# Patient Record
Sex: Male | Born: 2000 | Race: Black or African American | Hispanic: No | Marital: Single | Smoking: Never smoker
Health system: Southern US, Community
[De-identification: ages and names within clinical notes are randomized; demographics above are authoritative.]

---
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
## 2020-08-28 ENCOUNTER — Ambulatory Visit: Payer: Self-pay

## 2020-08-28 ENCOUNTER — Other Ambulatory Visit: Payer: Self-pay | Admitting: Family Medicine

## 2020-08-28 ENCOUNTER — Other Ambulatory Visit: Payer: Self-pay

## 2020-08-28 DIAGNOSIS — Z0000 Encounter for general adult medical examination without abnormal findings: Secondary | ICD-10-CM

## 2021-02-28 ENCOUNTER — Emergency Department (HOSPITAL_COMMUNITY)
Admission: EM | Admit: 2021-02-28 | Discharge: 2021-02-28 | Disposition: A | Discharge status: Home / Self Care | Payer: BC Managed Care – PPO | Attending: Emergency Medicine | Admitting: Emergency Medicine

## 2021-02-28 ENCOUNTER — Other Ambulatory Visit: Payer: Self-pay

## 2021-02-28 ENCOUNTER — Emergency Department (HOSPITAL_COMMUNITY): Payer: BC Managed Care – PPO

## 2021-02-28 ENCOUNTER — Encounter (HOSPITAL_COMMUNITY): Payer: Self-pay

## 2021-02-28 DIAGNOSIS — S93432A Sprain of tibiofibular ligament of left ankle, initial encounter: Secondary | ICD-10-CM | POA: Insufficient documentation

## 2021-02-28 DIAGNOSIS — S93492A Sprain of other ligament of left ankle, initial encounter: Secondary | ICD-10-CM

## 2021-02-28 DIAGNOSIS — S99912A Unspecified injury of left ankle, initial encounter: Secondary | ICD-10-CM | POA: Diagnosis present

## 2021-02-28 DIAGNOSIS — M25572 Pain in left ankle and joints of left foot: Secondary | ICD-10-CM | POA: Insufficient documentation

## 2021-02-28 DIAGNOSIS — X501XXA Overexertion from prolonged static or awkward postures, initial encounter: Secondary | ICD-10-CM | POA: Insufficient documentation

## 2021-02-28 DIAGNOSIS — Y9367 Activity, basketball: Secondary | ICD-10-CM | POA: Diagnosis not present

## 2021-02-28 NOTE — Discharge Instructions (Signed)
Please use Tylenol or ibuprofen for pain.  You may use 600 mg ibuprofen every 6 hours or 1000 mg of Tylenol every 6 hours.  You may choose to alternate between the 2.  This would be most effective.  Not to exceed 4 g of Tylenol within 24 hours.  Not to exceed 3200 mg ibuprofen 24 hours.  Rest the affected limb, apply ice. You can begin to bear weight as tolerated. If the pain is not improving despite all of the above in 1-2 weeks I recommend you follow up with Orthopedics.

## 2021-02-28 NOTE — ED Triage Notes (Signed)
Pt arrived via POV, c/o left ankle injury after landing on it while playing basketball.

## 2021-02-28 NOTE — ED Provider Notes (Signed)
[facility]-EMERGENCY DEPT Provider Note   CSN: [id number] Arrival date & time: 02/28/21  1723     History  Chief Complaint  Patient presents with   Ankle Pain    Seth Greene is a 21 y.o. male with no significant past medical history presents with complaint of rolling his left ankle while playing bass ball at 11 AM.  Patient reports that he has had difficulty walking on the left ankle since then.  He has not taken anything for pain prior to arrival.  Patient denies any numbness, tingling.   Ankle Pain     Home Medications Prior to Admission medications   Medication Sig Start Date End Date Taking? Authorizing Provider  ciprofloxacin (CIPRO) 500 MG tablet Take 1 tablet (500 mg total) by mouth 2 (two) times daily. 08/21/18   Elvina Sidle, MD      Allergies    Patient has no known allergies.    Review of Systems   Review of Systems  Musculoskeletal:  Positive for joint swelling.  All other systems reviewed and are negative.  Physical Exam Updated Vital Signs BP 122/87 (BP Location: Left Arm)    Pulse 84    Temp 98.1 F (36.7 C) (Oral)    Resp 18    SpO2 100%  Physical Exam Vitals and nursing note reviewed.  Constitutional:      General: He is not in acute distress.    Appearance: Normal appearance.  HENT:     Head: Normocephalic and atraumatic.  Eyes:     General:        Right eye: No discharge.        Left eye: No discharge.  Cardiovascular:     Rate and Rhythm: Normal rate and regular rhythm.     Pulses: Normal pulses.     Comments: DP, PT pulses intact bilaterally. Pulmonary:     Effort: Pulmonary effort is normal. No respiratory distress.  Musculoskeletal:        General: No deformity.     Comments: With tenderness to palpation on the ATFL distribution of the left ankle.  No step-off or deformity noted.  Intact range of motion both passively and actively.  Intact strength to plantar and dorsiflexion.  Intact flexion and extension  of the toes.  Skin:    General: Skin is warm and dry.     Capillary Refill: Capillary refill takes less than 2 seconds.  Neurological:     Mental Status: He is alert and oriented to person, place, and time.  Psychiatric:        Mood and Affect: Mood normal.        Behavior: Behavior normal.    ED Results / Procedures / Treatments   Labs (all labs ordered are listed, but only abnormal results are displayed) Labs Reviewed - No data to display  EKG None  Radiology DG Ankle Complete Left  Result Date: 02/28/2021 CLINICAL DATA:  Left ankle pain after falling on a while playing basketball. EXAM: LEFT ANKLE COMPLETE - 3+ VIEW COMPARISON:  None. FINDINGS: There is no evidence of fracture, dislocation, or joint effusion. There is no evidence of arthropathy or other focal bone abnormality. Soft tissues are unremarkable. IMPRESSION: Negative. Electronically Signed   By: Emmaline Kluver M.D.   On: 02/28/2021 18:34    Procedures Procedures    Medications Ordered in ED Medications - No data to display  ED Course/ Medical Decision Making/ A&P  Medical Decision Making Amount and/or Complexity of Data Reviewed Radiology: ordered.   This is an overall well-appearing 21 year old male who presents with acute left ankle injury.  My emergent differential diagnosis includes fracture, dislocation, including open fracture and dislocation.  On exam patient has tenderness palpation the ATFL distribution, pain with eversion of the ankle.  Findings consistent with acute ankle sprain.  I personally ordered and interpreted radiographic imaging of the left ankle which shows no fracture or dislocation.  Discussed with patient that he is able to weight-bear as tolerated, however as he is having significant pain at this time we will provide lace up ankle brace, crutches.  Encouraged him to begin with weightbearing as tolerated in the morning.  Encouraged ice, elevation,  ibuprofen, Tylenol.  Encourage follow-up with orthopedics if pain does not improve despite treatment for 1 to 2 weeks. Final Clinical Impression(s) / ED Diagnoses Final diagnoses:  Sprain of anterior talofibular ligament of left ankle, initial encounter    Rx / DC Orders ED Discharge Orders     None         Olene Floss, PA-C 02/28/21 1940    Franne Forts, DO 03/01/21 1309

## 2021-10-22 ENCOUNTER — Encounter (HOSPITAL_COMMUNITY): Payer: Self-pay | Admitting: Emergency Medicine

## 2021-10-22 ENCOUNTER — Other Ambulatory Visit: Payer: Self-pay

## 2021-10-22 ENCOUNTER — Ambulatory Visit (HOSPITAL_COMMUNITY)
Admission: EM | Admit: 2021-10-22 | Discharge: 2021-10-22 | Disposition: A | Discharge status: Home / Self Care | Payer: BC Managed Care – PPO | Attending: Internal Medicine | Admitting: Internal Medicine

## 2021-10-22 DIAGNOSIS — R059 Cough, unspecified: Secondary | ICD-10-CM | POA: Diagnosis not present

## 2021-10-22 DIAGNOSIS — J069 Acute upper respiratory infection, unspecified: Secondary | ICD-10-CM | POA: Insufficient documentation

## 2021-10-22 DIAGNOSIS — Z79899 Other long term (current) drug therapy: Secondary | ICD-10-CM | POA: Diagnosis not present

## 2021-10-22 DIAGNOSIS — Z1152 Encounter for screening for COVID-19: Secondary | ICD-10-CM | POA: Diagnosis not present

## 2021-10-22 LAB — RESP PANEL BY RT-PCR (FLU A&B, COVID) ARPGX2
Influenza A by PCR: NEGATIVE
Influenza B by PCR: NEGATIVE
SARS Coronavirus 2 by RT PCR: NEGATIVE

## 2021-10-22 MED ORDER — LIDOCAINE VISCOUS HCL 2 % MT SOLN: 15.0000 mL | Freq: Four times a day (QID) | OROMUCOSAL | 0 refills | Status: AC | PRN

## 2021-10-22 MED ORDER — IBUPROFEN 600 MG PO TABS: 600.0000 mg | ORAL_TABLET | Freq: Four times a day (QID) | ORAL | 0 refills | Status: AC | PRN

## 2021-10-22 MED ORDER — BENZONATATE 100 MG PO CAPS: 100.0000 mg | ORAL_CAPSULE | Freq: Three times a day (TID) | ORAL | 0 refills | Status: AC | PRN

## 2021-10-22 NOTE — Discharge Instructions (Addendum)
Maintain adequate hydration Warm salt water gargle Please take medications as prescribed We will call you with recommendations if labs are abnormal Please quarantine until lab results are available Return to urgent care if symptoms persist or worsens

## 2021-10-22 NOTE — ED Triage Notes (Signed)
Symptoms started yesterday.  Throat hurts with swallowing or coughing.  Complains of cough and sneezing and bad headaches.   Patient has not done a covid test.   Patient has not taken any medications for symptoms

## 2021-10-22 NOTE — ED Provider Notes (Signed)
[place]    CSN: [id number] Arrival date & time: 10/22/21  0808      History   Chief Complaint Chief Complaint  Patient presents with   URI    HPI Seth Greene is a 21 y.o. male comes to the urgent care with 1 day history of sore throat, cough and sneezing as well as headaches.  Symptoms started yesterday and has been persistent.  Patient's brother had similar symptoms several days ago and he apparently tested negative.  Patient denies any shortness of breath, wheezing, chest tightness or generalized body aches.  No dizziness, near syncope or syncopal episodes.  No diarrhea.  Patient is fully vaccinated against COVID-19 virus.  He denies any fever or chills  HPI  History reviewed. No pertinent past medical history.  There are no problems to display for this patient.   History reviewed. No pertinent surgical history.     Home Medications    Prior to Admission medications   Medication Sig Start Date End Date Taking? Authorizing Provider  benzonatate (TESSALON) 100 MG capsule Take 1 capsule (100 mg total) by mouth 3 (three) times daily as needed for cough. 10/22/21  Yes Xitlally Mooneyham, Myrene Galas, MD  ibuprofen (ADVIL) 600 MG tablet Take 1 tablet (600 mg total) by mouth every 6 (six) hours as needed. 10/22/21  Yes Makai Dumond, Myrene Galas, MD  magic mouthwash (lidocaine, diphenhydrAMINE, alum & mag hydroxide) suspension Swish and swallow 15 mLs 4 (four) times daily as needed for mouth pain. Compounding formula: Maalox-80 mL, viscous lidocaine 2%-80 mL, Benadryl 12.5 mg/ML-80 mg. 10/22/21  Yes Abubakar Crispo, Myrene Galas, MD    Family History Family History  Problem Relation Age of Onset   Healthy Mother    Healthy Father     Social History Social History   Tobacco Use   Smoking status: Never   Smokeless tobacco: Never  Vaping Use   Vaping Use: Never used  Substance Use Topics   Alcohol use: Never   Drug use: Never     Allergies   Patient has no known  allergies.   Review of Systems Review of Systems  Constitutional: Negative.   HENT:  Positive for sore throat.   Respiratory:  Positive for cough. Negative for shortness of breath, wheezing and stridor.   Genitourinary: Negative.   Musculoskeletal:  Negative for arthralgias and myalgias.  Neurological:  Positive for headaches.     Physical Exam Triage Vital Signs ED Triage Vitals  Enc Vitals Group     BP 10/22/21 0855 123/77     Pulse Rate 10/22/21 0855 88     Resp 10/22/21 0855 18     Temp 10/22/21 0855 98.2 F (36.8 C)     Temp Source 10/22/21 0855 Oral     SpO2 10/22/21 0855 97 %     Weight --      Height --      Head Circumference --      Peak Flow --      Pain Score 10/22/21 0851 7     Pain Loc --      Pain Edu? --      Excl. in [place]? --    No data found.  Updated Vital Signs BP 123/77 (BP Location: Right Arm)   Pulse 88   Temp 98.2 F (36.8 C) (Oral)   Resp 18   SpO2 97%   Visual Acuity Right Eye Distance:   Left Eye Distance:   Bilateral Distance:    Right Eye  Near:   Left Eye Near:    Bilateral Near:     Physical Exam Vitals and nursing note reviewed.  Constitutional:      General: He is not in acute distress.    Appearance: He is ill-appearing. He is not toxic-appearing or diaphoretic.  HENT:     Right Ear: Tympanic membrane normal.     Left Ear: Tympanic membrane normal.     Nose: No rhinorrhea.     Mouth/Throat:     Pharynx: No posterior oropharyngeal erythema.  Cardiovascular:     Rate and Rhythm: Normal rate and regular rhythm.     Pulses: Normal pulses.     Heart sounds: Normal heart sounds.  Abdominal:     General: There is no distension.     Tenderness: There is no guarding.  Neurological:     Mental Status: He is alert.      UC Treatments / Results  Labs (all labs ordered are listed, but only abnormal results are displayed) Labs Reviewed  RESP PANEL BY RT-PCR (FLU A&B, COVID) [id number]    EKG   Radiology No results  found.  Procedures Procedures (including critical care time)  Medications Ordered in UC Medications - No data to display  Initial Impression / Assessment and Plan / UC Course  I have reviewed the triage vital signs and the nursing notes.  Pertinent labs & imaging results that were available during my care of the patient were reviewed by me and considered in my medical decision making (see chart for details).     1.  Viral URI with cough: Tessalon Perles as needed for cough COVID-19 PCR test has been sent Flu a plus B has been sent Maintain adequate hydration Chloraseptic throat spray or Magic mouthwash as needed Return to urgent care if symptoms worsen. We will call you with recommendations if labs are abnormal. Final Clinical Impressions(s) / UC Diagnoses   Final diagnoses:  Viral URI with cough     Discharge Instructions      Maintain adequate hydration Warm salt water gargle Please take medications as prescribed We will call you with recommendations if labs are abnormal Please quarantine until lab results are available Return to urgent care if symptoms persist or worsens   ED Prescriptions     Medication Sig Dispense Auth. Provider   benzonatate (TESSALON) 100 MG capsule Take 1 capsule (100 mg total) by mouth 3 (three) times daily as needed for cough. 21 capsule Tannya Gonet, Britta Mccreedy, MD   ibuprofen (ADVIL) 600 MG tablet Take 1 tablet (600 mg total) by mouth every 6 (six) hours as needed. 30 tablet Shauntae Reitman, Britta Mccreedy, MD   magic mouthwash (lidocaine, diphenhydrAMINE, alum & mag hydroxide) suspension Swish and swallow 15 mLs 4 (four) times daily as needed for mouth pain. Compounding formula: Maalox-80 mL, viscous lidocaine 2%-80 mL, Benadryl 12.5 mg/ML-80 mg. 240 mL Johana Hopkinson, Britta Mccreedy, MD      PDMP not reviewed this encounter.   Merrilee Jansky, MD 10/22/21 [phone]

## 2022-01-02 ENCOUNTER — Other Ambulatory Visit: Payer: Self-pay

## 2022-01-02 ENCOUNTER — Ambulatory Visit (HOSPITAL_COMMUNITY)
Admission: EM | Admit: 2022-01-02 | Discharge: 2022-01-02 | Disposition: A | Discharge status: Home / Self Care | Payer: BC Managed Care – PPO | Attending: Physician Assistant | Admitting: Physician Assistant

## 2022-01-02 ENCOUNTER — Encounter (HOSPITAL_COMMUNITY): Payer: Self-pay | Admitting: Emergency Medicine

## 2022-01-02 DIAGNOSIS — J069 Acute upper respiratory infection, unspecified: Secondary | ICD-10-CM

## 2022-01-02 DIAGNOSIS — U071 COVID-19: Secondary | ICD-10-CM | POA: Diagnosis present

## 2022-01-02 LAB — RESP PANEL BY RT-PCR (FLU A&B, COVID) ARPGX2
Influenza A by PCR: NEGATIVE
Influenza B by PCR: NEGATIVE
SARS Coronavirus 2 by RT PCR: POSITIVE — AB

## 2022-01-02 MED ORDER — PROMETHAZINE-DM 6.25-15 MG/5ML PO SYRP: 5.0000 mL | ORAL_SOLUTION | Freq: Four times a day (QID) | ORAL | 0 refills | Status: AC | PRN

## 2022-01-02 NOTE — Discharge Instructions (Addendum)
COVID/Flu test pending, will call with results Recommend supportive care like rest drinking plenty of fluids. Can take Tylenol or ibuprofen as needed for headaches and bodyaches. Take cough syrup as needed for cough. Recommend staying away from others for 5 days and then wear a mask around others for 5 days.  Return if your symptoms become worse.

## 2022-01-02 NOTE — ED Provider Notes (Signed)
[facility]    CSN: [id number] Arrival date & time: 01/02/22  1120      History   Chief Complaint Chief Complaint  Patient presents with   Cough    HPI Seth Greene is a 21 y.o. male.   Patient complains of cough, congestion, headache that started yesterday.  Reports chills and bodyaches.  Denies wheezing or shortness of breat. he has taken nothing for the symptoms.  Reports wife is sick with similar symptoms.  Cough is worse at night.    History reviewed. No pertinent past medical history.  There are no problems to display for this patient.   History reviewed. No pertinent surgical history.     Home Medications    Prior to Admission medications   Medication Sig Start Date End Date Taking? Authorizing Provider  promethazine-dextromethorphan (PROMETHAZINE-DM) 6.25-15 MG/5ML syrup Take 5 mLs by mouth 4 (four) times daily as needed for cough. 01/02/22  Yes Ward, Tylene Fantasia, PA-C  benzonatate (TESSALON) 100 MG capsule Take 1 capsule (100 mg total) by mouth 3 (three) times daily as needed for cough. Patient not taking: Reported on 01/02/2022 10/22/21   Merrilee Jansky, MD  ibuprofen (ADVIL) 600 MG tablet Take 1 tablet (600 mg total) by mouth every 6 (six) hours as needed. 10/22/21   LampteyBritta Mccreedy, MD  magic mouthwash (lidocaine, diphenhydrAMINE, alum & mag hydroxide) suspension Swish and swallow 15 mLs 4 (four) times daily as needed for mouth pain. Compounding formula: Maalox-80 mL, viscous lidocaine 2%-80 mL, Benadryl 12.5 mg/ML-80 mg. Patient not taking: Reported on 01/02/2022 10/22/21   Lamptey, Britta Mccreedy, MD    Family History Family History  Problem Relation Age of Onset   Healthy Mother    Healthy Father     Social History Social History   Tobacco Use   Smoking status: Never   Smokeless tobacco: Never  Vaping Use   Vaping Use: Never used  Substance Use Topics   Alcohol use: Never   Drug use: Never     Allergies   Patient has no  known allergies.   Review of Systems Review of Systems  Constitutional:  Positive for chills. Negative for fever.  HENT:  Positive for congestion. Negative for ear pain and sore throat.   Eyes:  Negative for pain and visual disturbance.  Respiratory:  Positive for cough. Negative for shortness of breath and wheezing.   Cardiovascular:  Negative for chest pain and palpitations.  Gastrointestinal:  Negative for abdominal pain and vomiting.  Genitourinary:  Negative for dysuria and hematuria.  Musculoskeletal:  Negative for arthralgias and back pain.  Skin:  Negative for color change and rash.  Neurological:  Negative for seizures and syncope.  All other systems reviewed and are negative.    Physical Exam Triage Vital Signs ED Triage Vitals  Enc Vitals Group     BP 01/02/22 1328 115/80     Pulse Rate 01/02/22 1328 (!) 105     Resp 01/02/22 1328 20     Temp 01/02/22 1328 98.8 F (37.1 C)     Temp Source 01/02/22 1328 Oral     SpO2 01/02/22 1328 98 %     Weight --      Height --      Head Circumference --      Peak Flow --      Pain Score 01/02/22 1327 5     Pain Loc --      Pain Edu? --  Excl. in GC? --    No data found.  Updated Vital Signs BP 115/80 (BP Location: Right Arm)   Pulse (!) 105   Temp 98.8 F (37.1 C) (Oral)   Resp 20   SpO2 98%   Visual Acuity Right Eye Distance:   Left Eye Distance:   Bilateral Distance:    Right Eye Near:   Left Eye Near:    Bilateral Near:     Physical Exam Vitals and nursing note reviewed.  Constitutional:      General: He is not in acute distress.    Appearance: He is well-developed.  HENT:     Head: Normocephalic and atraumatic.  Eyes:     Conjunctiva/sclera: Conjunctivae normal.  Cardiovascular:     Rate and Rhythm: Normal rate and regular rhythm.     Heart sounds: No murmur heard. Pulmonary:     Effort: Pulmonary effort is normal. No respiratory distress.     Breath sounds: Normal breath sounds.   Abdominal:     Palpations: Abdomen is soft.     Tenderness: There is no abdominal tenderness.  Musculoskeletal:        General: No swelling.     Cervical back: Neck supple.  Skin:    General: Skin is warm and dry.     Capillary Refill: Capillary refill takes less than 2 seconds.  Neurological:     Mental Status: He is alert.  Psychiatric:        Mood and Affect: Mood normal.      UC Treatments / Results  Labs (all labs ordered are listed, but only abnormal results are displayed) Labs Reviewed  RESP PANEL BY RT-PCR (FLU A&B, COVID) [id number]    EKG   Radiology No results found.  Procedures Procedures (including critical care time)  Medications Ordered in UC Medications - No data to display  Initial Impression / Assessment and Plan / UC Course  I have reviewed the triage vital signs and the nursing notes.  Pertinent labs & imaging results that were available during my care of the patient were reviewed by me and considered in my medical decision making (see chart for details).     URI, COVID-19 and flu test pending.  Patient overall well-appearing in no acute distress, vitals within normal, lungs clear. Stable for discharge with supportive care. Return precautions discussed.  Final Clinical Impressions(s) / UC Diagnoses   Final diagnoses:  Acute upper respiratory infection     Discharge Instructions      COVID/Flu test pending, will call with results Recommend supportive care like rest drinking plenty of fluids. Can take Tylenol or ibuprofen as needed for headaches and bodyaches. Take cough syrup as needed for cough. Recommend staying away from others for 5 days and then wear a mask around others for 5 days.  Return if your symptoms become worse.     ED Prescriptions     Medication Sig Dispense Auth. Provider   promethazine-dextromethorphan (PROMETHAZINE-DM) 6.25-15 MG/5ML syrup Take 5 mLs by mouth 4 (four) times daily as needed for cough. 118 mL Ward,  Tylene Fantasia, PA-C      PDMP not reviewed this encounter.   Ward, Tylene Fantasia, PA-C 01/02/22 1443

## 2022-01-02 NOTE — ED Triage Notes (Signed)
Cough and headache started yesterday.  Patient reports a runny nose.  Patient has not taken any medications.

## 2022-01-06 ENCOUNTER — Telehealth (HOSPITAL_COMMUNITY): Payer: Self-pay | Admitting: Emergency Medicine

## 2022-01-06 NOTE — Telephone Encounter (Signed)
Patient returned call after third attempt and we rviewed his positive COVID result.  No questions at this time

## 2022-02-16 ENCOUNTER — Telehealth: Payer: Self-pay

## 2022-02-16 NOTE — Telephone Encounter (Signed)
Sending mychart msg. AS, CMA 

## 2023-03-23 DIAGNOSIS — H5213 Myopia, bilateral: Secondary | ICD-10-CM | POA: Diagnosis not present

## 2023-07-14 IMAGING — CR DG ANKLE COMPLETE 3+V*L*
4 series · 4 of 4 positions shown · non-contrast
Comparison: None.

CLINICAL DATA: Left ankle pain after falling on a while playing
basketball.

EXAM:
LEFT ANKLE COMPLETE - 3+ VIEW

[x ankle lat left]
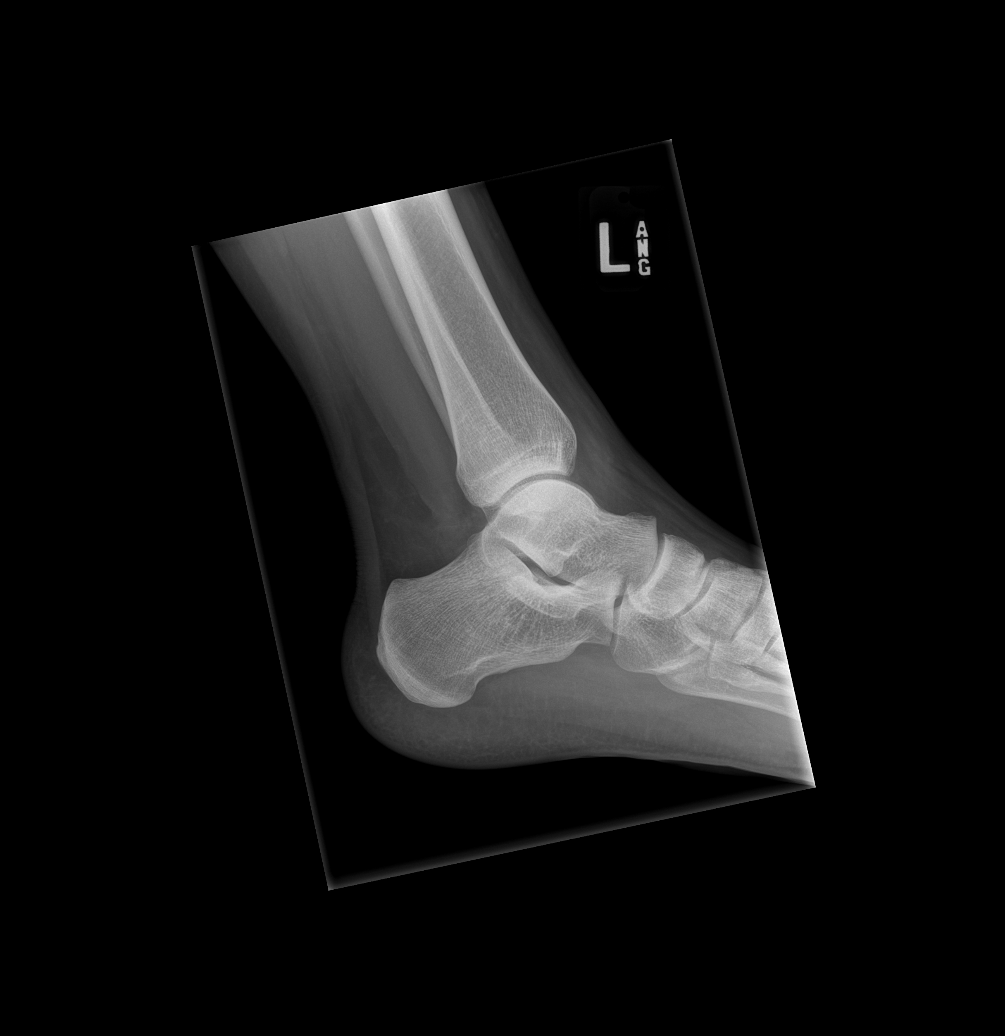

[x ankle ap left]
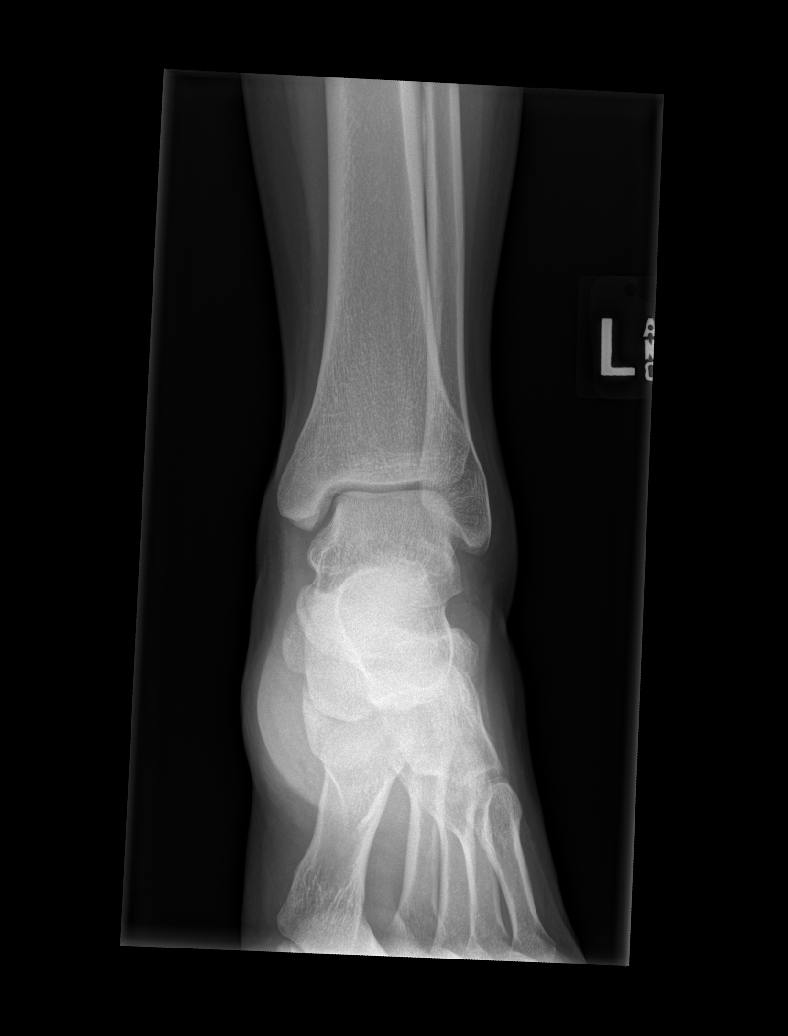

[x ankle obl left (1 of 2)]
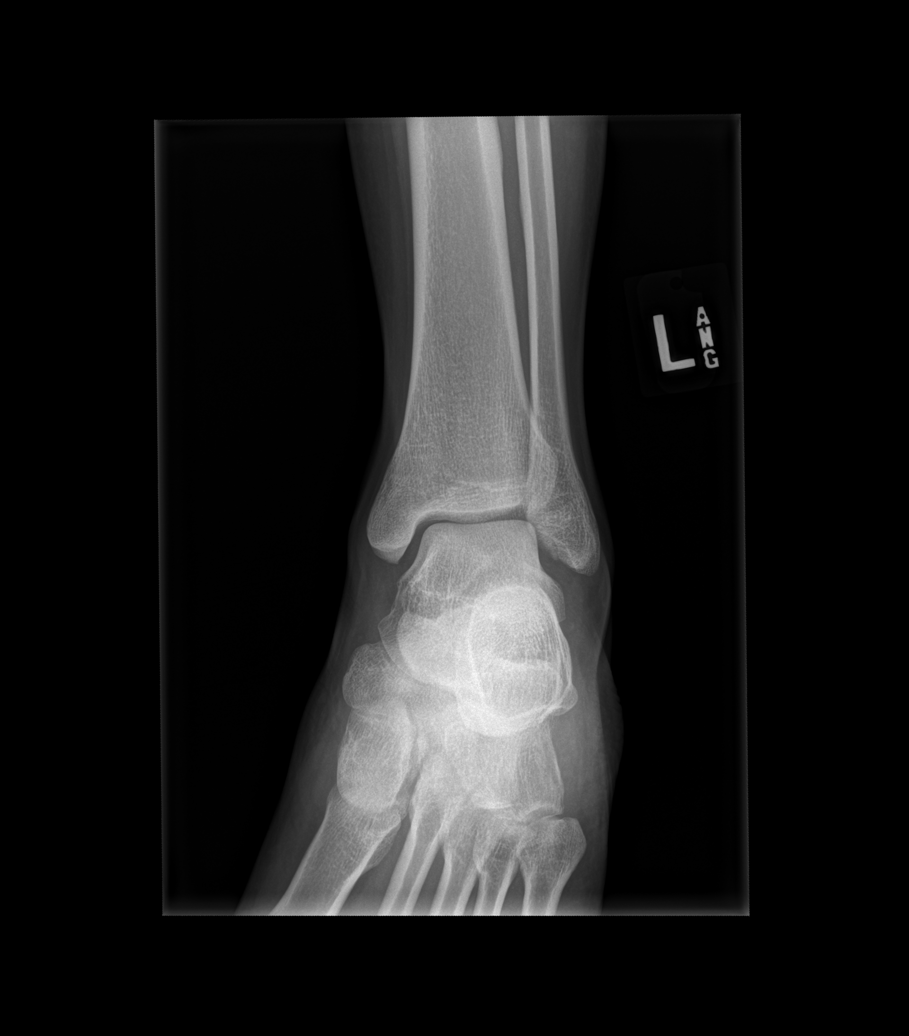

[x ankle obl left (2 of 2)]
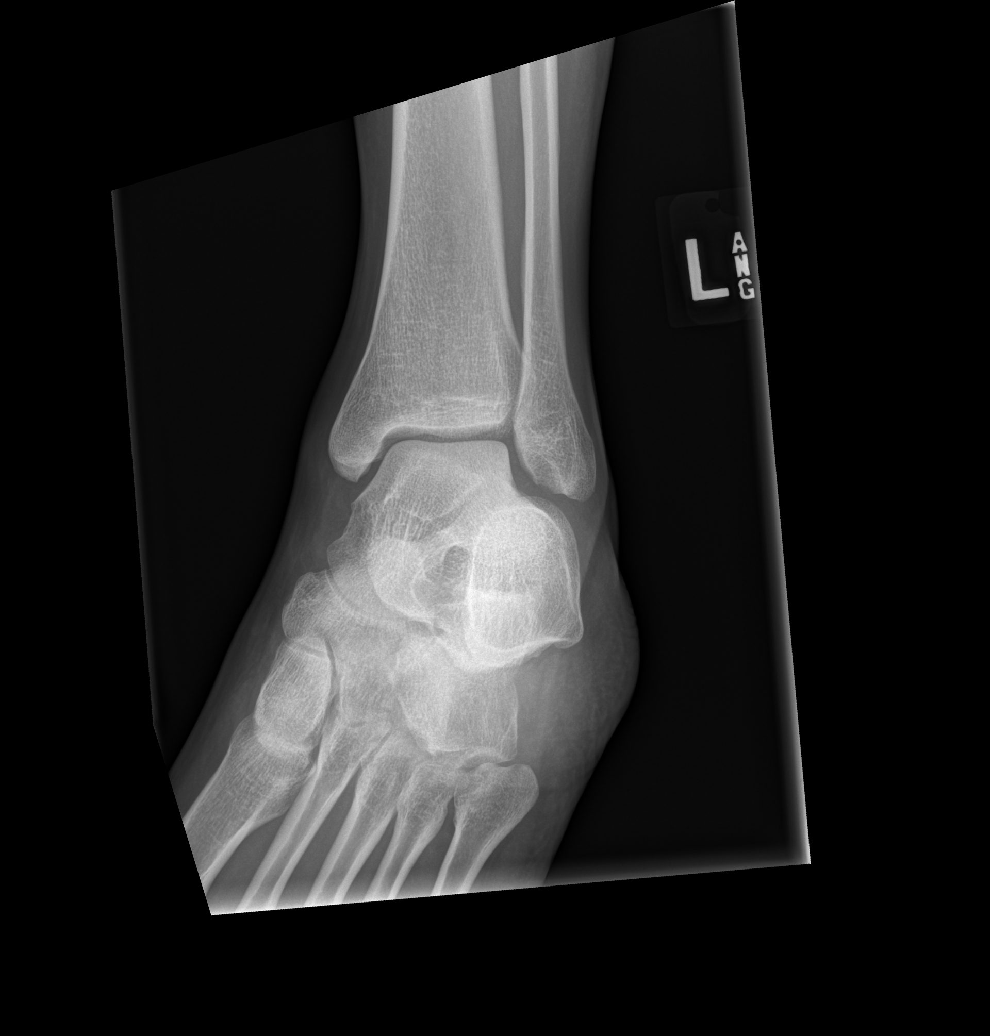

[4 of 4 positions shown; findings below may reference images not displayed]

FINDINGS: There is no evidence of fracture, dislocation, or joint effusion.
There is no evidence of arthropathy or other focal bone abnormality.
Soft tissues are unremarkable.
IMPRESSION: Negative.
